# Patient Record
Sex: Female | Born: 2005 | Race: Black or African American | Hispanic: No | Marital: Single | Smoking: Former smoker
Health system: Southern US, Community
[De-identification: ages and names within clinical notes are randomized; demographics above are authoritative.]

## PROBLEM LIST (undated history)

## (undated) ENCOUNTER — Inpatient Hospital Stay (HOSPITAL_COMMUNITY): Payer: Self-pay

## (undated) DIAGNOSIS — F319 Bipolar disorder, unspecified: Secondary | ICD-10-CM

## (undated) DIAGNOSIS — F909 Attention-deficit hyperactivity disorder, unspecified type: Secondary | ICD-10-CM

## (undated) DIAGNOSIS — H539 Unspecified visual disturbance: Secondary | ICD-10-CM

## (undated) DIAGNOSIS — R51 Headache: Secondary | ICD-10-CM

## (undated) DIAGNOSIS — R519 Headache, unspecified: Secondary | ICD-10-CM

## (undated) DIAGNOSIS — F32A Depression, unspecified: Secondary | ICD-10-CM

## (undated) HISTORY — PX: NO PAST SURGERIES: SHX2092

---
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
## 2023-12-30 NOTE — Progress Notes (Signed)
 [facility] New Patient Visit Latoya Flores DOB: 11-02-2005  MRN: [id number] Visit Date: 12/30/2023  Encounter Provider: Manuelita Murray Pizza, MD   Subjective:  Cc:  Chief Complaint  Patient presents with  . Menorrhagia    Irregular cycles  . Weight Gain  . mood change  . Dysmenorrhea   Latoya Flores is a 18 y.o. female who presents today for evaluation and treatment of bleeding with nexplanon.  Periods occur once or twice a month. No pattern.  Sometimes bleeds and has to change pad q 1hour which could be 2 weeks long and bad cramping which pt did not have before nexplanon.    Also weight gain and some issues with depression.   Objective:  BP 118/80   Ht 1.575 m (5' 2)   Wt 83.9 kg (185 lb)   BMI 33.84 kg/m  There are no problems to display for this patient.   History reviewed. No pertinent past medical history.  History reviewed. No pertinent surgical history.    Allergies  Allergen Reactions  . Kiwi (Actinidia Chinensis) Hives and Itching    Tongue itches  . Mango Hives  . Mango Flavor Rash  . Papaya Rash  . Shrimp Rash  . Sulfa  (Sulfonamide Antibiotics) Rash and Hives      Social History   Social History Narrative  . Not on file    No family history on file.  Review of Systems A 14-point ROS was performed with pertinent positives/negatives noted in the HPI. The remainder of the ROS are negative.  Physical Exam  Constitutional: She is well groomed. She appears well-developed.  Abdominal: Soft. Bowel sounds are normal. No hepatosplenomegaly. No hernia. No masses. No tenderness.  Genitourinary:  EGBUS: within normal limits with age-appropriate hair distribution. No lesions noted.  Urethral meatus: Normal size and location without prolapse. Urethra: Nontender. Without masses. Bladder: Nontender. No cystocele. Vagina: There is no blood  in the vaginal vault. No discharge. No rectocele.  Cervix:  No friability. No  lesions Uterus: Normal size. Normal shape. Nontender.  Adnexa: There are no palpable adnexal masses. No tenderness.  Anus: Normal. No prolapsed hemorrhoids. No lesions. Psychiatric: She has a normal mood and affect. Her behavior is normal. Judgment and thought content normal.    A chaperone was present for the exam Labs: Recent Results (from the past week)  POC HCG Qualitative, Urine   Collection Time: 12/30/23  4:00 PM  Result Value Ref Range   HCG, Urine, POC Negative Negative   Internal Control Acceptable    Kit/Device Lot # [id number]    Kit/Device Expiration Date 05/22/25     Assessment/Plan:     ICD-10-CM   1. Pregnancy examination or test, negative result  Z32.02 POC HCG Qualitative, Urine    2. Menorrhagia with irregular cycle  N92.1      Pt offered removal nexplanon or estrace/OCPS to thicken endometrium to help with bleeding. Since also concerned about weight gain and depression Will remove nexplanon and start Yasmin for bleeding.  Will also check STDS to ensure not source of bleeding  I have personally spent 25 minutes involved in face-to-face and non-face-to-face activities for this patient on the day of the visit.  Professional time spent includes the following activities, in addition to those noted in the documentation: -reviewing her medications, allergies, her medical, surgical, obstetric, gynecologic, and family history in preparation for the patient's visit, -reviewing the chart in preparation, -in counseling and educating the patient  and/or family/caregiver if present, -in ordering medications, test, or procedures, -and in documentation after patient has checked out from office.  Orders Placed This Encounter  Procedures  . POC HCG Qualitative, Urine    Requested Prescriptions    No prescriptions requested or ordered in this encounter      Electronically signed by: Manuelita Murray Pizza, MD 12/30/2023 4:11 PM

## 2024-01-11 NOTE — Progress Notes (Signed)
 Insertion/Removal/Reinsertion of Contraceptive Device  Date/Time: 01/11/2024 12:06 AM  Performed by: Manuelita Murray Pizza, MD Authorized by: Manuelita Murray Pizza, MD   Consent:    Consent obtained:  Written   Consent given by:  Patient   Procedural risks discussed:  Bleeding and infection   Patient questions answered: yes     Patient agrees, verbalizes understanding, and wants to proceed: yes     Educational handouts given: yes     Instructions and paperwork completed: yes     Indication: presence of non-biodegradable drug delivery implant   Pre-procedure:    Prepped with: povidone-iodine   Local anesthetic:  Lidocaine  with epinephrine Procedure: removal Removed intact device with no complications: Yes  Procedure Type: subdermal Post-procedure:  Patient tolerated procedure well: Yes Patient will follow up after next period: Yes Comments:Will start OCPS to help with bleeding which is why along with weight gain implant removed.

## 2024-05-19 ENCOUNTER — Ambulatory Visit: Payer: MEDICAID | Admitting: Certified Nurse Midwife

## 2024-05-19 ENCOUNTER — Ambulatory Visit (INDEPENDENT_AMBULATORY_CARE_PROVIDER_SITE_OTHER): Payer: MEDICAID | Admitting: Certified Nurse Midwife

## 2024-05-19 VITALS — BP 126/84 | HR 98 | Ht 62.0 in | Wt 195.9 lb

## 2024-05-19 DIAGNOSIS — Z3009 Encounter for other general counseling and advice on contraception: Secondary | ICD-10-CM

## 2024-05-19 DIAGNOSIS — Z3046 Encounter for surveillance of implantable subdermal contraceptive: Secondary | ICD-10-CM

## 2024-05-19 DIAGNOSIS — Z30011 Encounter for initial prescription of contraceptive pills: Secondary | ICD-10-CM

## 2024-05-19 MED ORDER — NORETHINDRONE 0.35 MG PO TABS: 1.0000 | ORAL_TABLET | Freq: Every day | ORAL | 11 refills | Status: DC

## 2024-05-19 NOTE — Progress Notes (Signed)
 Pt presents for Nexplanon removal. Pt wants the pill for bc. Pt has no questions or concerns at this time.

## 2024-05-24 NOTE — Progress Notes (Signed)
 GYNECOLOGY CLINIC PROCEDURE NOTE  [name] is a 18 y.o. G2P1001 here for Nexplanon removal. She desires to switch to Birth Control pills. Previously had implant in for 1 year. There is a note in from [facility] that reports the devices was removed, however patient denies that visit and device felt in place by Provider today. No other gynecologic concerns.   Nexplanon Removal Patient was given informed consent for removal of her Nexplanon.  Appropriate time out taken. Nexplanon site identified.  Area prepped in usual sterile fashon. One ml of 1% lidocaine  was used to anesthetize the area at the distal end of the implant. A small stab incision was made right beside the implant on the distal portion.  The Nexplanon rod was grasped using hemostats and removed with minimal difficulty.  There was minimal blood loss. There were no complications. Steri-strips were applied over the small incision.  A pressure bandage was applied to reduce any bruising.  The patient tolerated the procedure well and was given post procedure instructions.  Patient is planning to use Micronor  for contraception.    Emilio Delilah HERO, CNM 05/24/2024 6:07 PM

## 2024-06-17 ENCOUNTER — Encounter: Payer: Self-pay | Admitting: Certified Nurse Midwife

## 2024-09-17 NOTE — Progress Notes (Signed)
" °  Subjective Patient ID: Latoya Flores is a 18 y.o. female.  Chief Complaint  Patient presents with   Urinary Tract Infection    + lower abdomen pain, frequency, and  some burning with her urination. Per patient she is also having some nausea.    The following information was reviewed by members of the visit team:  Tobacco  Allergies  Meds  OB Status      HPI Patient reports urinary frequency and urgency and burning with urination.  She does not have any lower abdominal pain per se although palpation there makes her feel the need to urinate.  She reports she has been taking the minipill and having very irregular periods show she she has no idea when she could have gotten pregnant. Review of Systems Review of systems is negative except as noted in nurses notes in HPI. Objective Physical Exam Well-developed well-nourished female in no apparent distress Back is nontender to palpation percussion over the CVA areas Abdomen is soft and nontender palpation suprapubically makes you feel the need to urinate though. Assessment/Plan Diagnosis UTI, pregnancy.  Will treat with Keflex  and have prescribed prenatal vitamins with folate as well.  Patient will try ginger ale and small meals first.  If anything gets worse or come back in for nausea is not improving or if she begins vomiting she will come back .  I have put in a consult with OB/GYN.   Electronically signed: Deward Gwenn Ates, MD 09/17/2024  3:11 PM   "

## 2024-09-28 ENCOUNTER — Other Ambulatory Visit (INDEPENDENT_AMBULATORY_CARE_PROVIDER_SITE_OTHER): Payer: Self-pay

## 2024-09-28 ENCOUNTER — Ambulatory Visit: Payer: MEDICAID | Admitting: *Deleted

## 2024-09-28 VITALS — BP 122/79 | HR 93 | Wt 192.9 lb

## 2024-09-28 DIAGNOSIS — Z3A01 Less than 8 weeks gestation of pregnancy: Secondary | ICD-10-CM

## 2024-09-28 DIAGNOSIS — Z3481 Encounter for supervision of other normal pregnancy, first trimester: Secondary | ICD-10-CM

## 2024-09-28 DIAGNOSIS — Z3480 Encounter for supervision of other normal pregnancy, unspecified trimester: Secondary | ICD-10-CM | POA: Insufficient documentation

## 2024-09-28 MED ORDER — BLOOD PRESSURE KIT DEVI: 1.0000 | 0 refills | Status: AC | PRN

## 2024-09-28 MED ORDER — GOJJI WEIGHT SCALE MISC: 1.0000 | 0 refills | Status: AC | PRN

## 2024-09-28 NOTE — Progress Notes (Signed)
..  New OB Intake  I connected with Latoya Flores  on 09/28/2024 at  9:15 AM EST in person and verified that I am speaking with the correct person using two identifiers. Nurse is located at [facility] and pt is located at [facility].  I discussed the limitations, risks, security and privacy concerns of performing an evaluation and management service by telephone and the availability of in person appointments. I also discussed with the patient that there may be a patient responsible charge related to this service. The patient expressed understanding and agreed to proceed.  I explained I am completing New OB Intake today. We discussed EDD of 05/17/25 based on US  at [redacted]w[redacted]d weeks. Pt is G3P2002. I reviewed her allergies, medications and Medical/Surgical/OB history.    Patient Active Problem List   Diagnosis Date Noted   Supervision of other normal pregnancy, antepartum 11/25/2021   Hypokalemia 06/11/2016   At risk for intentional self-harm 06/06/2016   Depression, major, recurrent, moderate (HCC) 06/06/2016   MDD (major depressive disorder), recurrent severe, without psychosis (HCC) 06/06/2016     Concerns addressed today  Delivery Plans Plans to deliver at [facility]. Discussed the nature of our practice with multiple providers including residents and students as well as female and female providers. Due to the size of the practice, the delivering provider may not be the same as those providing prenatal care.   Patient is possibly interested in water birth.  MyChart/Babyscripts MyChart access verified. I explained pt will have some visits in office and some virtually. Babyscripts instructions given and order placed. Patient verifies receipt of registration text/e-mail. Account successfully created and app downloaded. If patient is a candidate for Optimized scheduling, add to sticky note.   Blood Pressure Cuff/Weight Scale Blood pressure cuff ordered for patient to pick-up from [employer]. Explained  after first prenatal appt pt will check weekly and document in Babyscripts. Patient does not have weight scale; order sent to [facility], patient may track weight weekly in Babyscripts.  Anatomy US  Explained first scheduled US  will be around 19 weeks. Anatomy US  scheduled for TBD at TBD.  Is patient a candidate for Babyscripts Optimization? No, due to Risk Factors   First visit review I reviewed new OB appt with patient. Explained pt will be seen by Dr. Rudy at first visit. Discussed [facility] genetic screening with patient. Requests Panorama and Horizon.. Routine prenatal labs OB Urine collected at today's visits. Further prenatal labs deferred until reconfirmation of viability.   Last Pap No results found for: DIAGPAP  Latoya [name], RN 09/28/2024  9:51 AM

## 2024-09-28 NOTE — Patient Instructions (Signed)

## 2024-09-29 ENCOUNTER — Other Ambulatory Visit: Payer: Self-pay

## 2024-09-29 MED ORDER — DOXYLAMINE-PYRIDOXINE 10-10 MG PO TBEC: 2.0000 | DELAYED_RELEASE_TABLET | Freq: Every day | ORAL | 5 refills | Status: AC

## 2024-10-01 ENCOUNTER — Encounter (HOSPITAL_COMMUNITY): Payer: Self-pay | Admitting: Obstetrics and Gynecology

## 2024-10-01 ENCOUNTER — Inpatient Hospital Stay (HOSPITAL_COMMUNITY): Payer: MEDICAID

## 2024-10-01 ENCOUNTER — Inpatient Hospital Stay (HOSPITAL_COMMUNITY)
Admission: AD | Admit: 2024-10-01 | Discharge: 2024-10-01 | Disposition: A | Discharge status: Home / Self Care | Payer: MEDICAID | Attending: Obstetrics and Gynecology | Admitting: Obstetrics and Gynecology

## 2024-10-01 DIAGNOSIS — O26891 Other specified pregnancy related conditions, first trimester: Secondary | ICD-10-CM | POA: Diagnosis present

## 2024-10-01 DIAGNOSIS — O418X10 Other specified disorders of amniotic fluid and membranes, first trimester, not applicable or unspecified: Secondary | ICD-10-CM

## 2024-10-01 DIAGNOSIS — O2341 Unspecified infection of urinary tract in pregnancy, first trimester: Secondary | ICD-10-CM | POA: Diagnosis not present

## 2024-10-01 DIAGNOSIS — O208 Other hemorrhage in early pregnancy: Secondary | ICD-10-CM | POA: Diagnosis not present

## 2024-10-01 DIAGNOSIS — Z3A01 Less than 8 weeks gestation of pregnancy: Secondary | ICD-10-CM | POA: Diagnosis not present

## 2024-10-01 DIAGNOSIS — O219 Vomiting of pregnancy, unspecified: Secondary | ICD-10-CM | POA: Diagnosis not present

## 2024-10-01 DIAGNOSIS — O468X1 Other antepartum hemorrhage, first trimester: Secondary | ICD-10-CM | POA: Diagnosis not present

## 2024-10-01 LAB — CBC
HCT: 35.8 % — ABNORMAL LOW (ref 36.0–46.0)
Hemoglobin: 11.7 g/dL — ABNORMAL LOW (ref 12.0–15.0)
MCH: 23.8 pg — ABNORMAL LOW (ref 26.0–34.0)
MCHC: 32.7 g/dL (ref 30.0–36.0)
MCV: 72.9 fL — ABNORMAL LOW (ref 80.0–100.0)
Platelets: 299 K/uL (ref 150–400)
RBC: 4.91 MIL/uL (ref 3.87–5.11)
RDW: 15.8 % — ABNORMAL HIGH (ref 11.5–15.5)
WBC: 10 K/uL (ref 4.0–10.5)
nRBC: 0 % (ref 0.0–0.2)

## 2024-10-01 LAB — URINALYSIS, ROUTINE W REFLEX MICROSCOPIC
Bilirubin Urine: NEGATIVE
Glucose, UA: NEGATIVE mg/dL
Hgb urine dipstick: NEGATIVE
Ketones, ur: NEGATIVE mg/dL
Nitrite: POSITIVE — AB
Protein, ur: 30 mg/dL — AB
Specific Gravity, Urine: 1.024 (ref 1.005–1.030)
pH: 6 (ref 5.0–8.0)

## 2024-10-01 LAB — COMPREHENSIVE METABOLIC PANEL WITH GFR
ALT: 11 U/L (ref 0–44)
AST: 14 U/L — ABNORMAL LOW (ref 15–41)
Albumin: 4.4 g/dL (ref 3.5–5.0)
Alkaline Phosphatase: 61 U/L (ref 38–126)
Anion gap: 10 (ref 5–15)
BUN: 12 mg/dL (ref 6–20)
CO2: 23 mmol/L (ref 22–32)
Calcium: 9.8 mg/dL (ref 8.9–10.3)
Chloride: 102 mmol/L (ref 98–111)
Creatinine, Ser: 0.63 mg/dL (ref 0.44–1.00)
GFR, Estimated: >60 mL/min
Glucose, Bld: 94 mg/dL (ref 70–99)
Potassium: 4.3 mmol/L (ref 3.5–5.1)
Sodium: 135 mmol/L (ref 135–145)
Total Bilirubin: 0.3 mg/dL (ref 0.0–1.2)
Total Protein: 7.9 g/dL (ref 6.5–8.1)

## 2024-10-01 MED ORDER — FAMOTIDINE IN NACL 20-0.9 MG/50ML-% IV SOLN
20.0000 mg | Freq: Once | INTRAVENOUS | Status: AC
  Administered 2024-10-01: 20 mg via INTRAVENOUS
  Filled 2024-10-01: qty 50

## 2024-10-01 MED ORDER — CEFADROXIL 500 MG PO CAPS: 500.0000 mg | ORAL_CAPSULE | Freq: Two times a day (BID) | ORAL | 0 refills | Status: AC

## 2024-10-01 MED ORDER — KETOROLAC TROMETHAMINE 30 MG/ML IJ SOLN
30.0000 mg | Freq: Once | INTRAMUSCULAR | Status: AC
  Administered 2024-10-01: 30 mg via INTRAVENOUS
  Filled 2024-10-01: qty 1

## 2024-10-01 MED ORDER — LACTATED RINGERS IV BOLUS
1000.0000 mL | Freq: Once | INTRAVENOUS | Status: AC
  Administered 2024-10-01: 1000 mL via INTRAVENOUS

## 2024-10-01 MED ORDER — ONDANSETRON HCL 8 MG PO TABS: 8.0000 mg | ORAL_TABLET | Freq: Three times a day (TID) | ORAL | 1 refills | Status: AC | PRN

## 2024-10-01 NOTE — MAU Note (Signed)
 Latoya Flores is a 19 y.o. at [redacted]w[redacted]d here in MAU reporting: abdominal pain that she has for the past 5 days that she says has gotten intense since earlier today.  Denies VB or vaginal discharge.  Pt reports that she has been taken nausea medication as someone told her that her abd pain could come from dry heaving and it is not effective.   Pt reports that she took Tylenol  at 0245 and was not effective.     LMP: 07/25/2024 Onset of complaint: this morning  Pain score: 8.5- when lying down a 10 Vitals:   10/01/24 1503  BP: 113/77  Pulse: (!) 109  Resp: 17  Temp: 98.4 F (36.9 C)  SpO2: 100%     FHT: <10weeks  Lab orders placed from triage:  none

## 2024-10-01 NOTE — MAU Provider Note (Signed)
 " History     CSN: [id number]  Arrival date and time: 10/01/24 1441   Event Date/Time   First Provider Initiated Contact with Patient 10/01/24 1524      Chief Complaint  Patient presents with   Abdominal Pain   HPI  Ms.Latoya Flores is a 19 y.o. female G3P2002 @ [redacted]w[redacted]d here in MAU with abdominal pain. She Reports all over abdominal pain, worse at night. No bleeding. The pain seems to be better during the day.  Last BM was yesterday and was normal Last vomiting was this morning. She is taking Diclegis  4x per day which only helps minimally.   Reports dry heaving all day. Not eating or drinking much.   OB History     Gravida  3   Para  2   Term  2   Preterm  0   AB  0   Living  2      SAB  0   IAB  0   Ectopic  0   Multiple  0   Live Births  2           Past Medical History:  Diagnosis Date   ADHD (attention deficit hyperactivity disorder)    Bipolar 1 disorder (HCC)    age 19   Depression    Headache    Vision abnormalities     Past Surgical History:  Procedure Laterality Date   NO PAST SURGERIES      Family History  Adopted: Yes  Problem Relation Age of Onset   Mental illness Mother    Mental illness Maternal Aunt    Alcohol abuse Maternal Uncle    Mental illness Maternal Grandmother     Social History[1]  Allergies: Allergies[2]  Medications Prior to Admission  Medication Sig Dispense Refill Last Dose/Taking   Blood Pressure Monitoring (BLOOD PRESSURE KIT) DEVI 1 kit by Does not apply route as needed. 1 each 0    Doxylamine -Pyridoxine  (DICLEGIS ) 10-10 MG TBEC Take 2 tablets by mouth at bedtime. If symptoms persist, add one tablet in the morning and one in the afternoon 100 tablet 5    ferrous sulfate 324 MG TBEC Take 324 mg by mouth. (Patient not taking: Reported on 09/28/2024)      folic acid  (FOLVITE ) 1 MG tablet Take 1 mg by mouth daily. (Patient not taking: Reported on 09/28/2024)      Misc. Devices (GOJJI WEIGHT SCALE) MISC 1 Device by  Does not apply route as needed. 1 each 0    norethindrone  (MICRONOR ) 0.35 MG tablet Take 1 tablet (0.35 mg total) by mouth daily. (Patient not taking: Reported on 09/28/2024) 28 tablet 11    prenatal vitamin w/FE, FA (PRENATAL 1 + 1) 27-1 MG TABS tablet Take 1 tablet by mouth daily at 12 noon.      Results for orders placed or performed during the hospital encounter of 10/01/24 (from the past 48 hours)  Urinalysis, Routine w reflex microscopic -Urine, Clean Catch     Status: Abnormal   Collection Time: 10/01/24  3:10 PM  Result Value Ref Range   Color, Urine AMBER (A) YELLOW    Comment: BIOCHEMICALS MAY BE AFFECTED BY COLOR   APPearance CLOUDY (A) CLEAR   Specific Gravity, Urine 1.024 1.005 - 1.030   pH 6.0 5.0 - 8.0   Glucose, UA NEGATIVE NEGATIVE mg/dL   Hgb urine dipstick NEGATIVE NEGATIVE   Bilirubin Urine NEGATIVE NEGATIVE   Ketones, ur NEGATIVE NEGATIVE mg/dL   Protein, ur  30 (A) NEGATIVE mg/dL   Nitrite POSITIVE (A) NEGATIVE   Leukocytes,Ua LARGE (A) NEGATIVE   RBC / HPF 21-50 0 - 5 RBC/hpf   WBC, UA 21-50 0 - 5 WBC/hpf   Bacteria, UA RARE (A) NONE SEEN   Squamous Epithelial / HPF 21-50 0 - 5 /HPF   Mucus PRESENT     Comment: Performed at [facility], [address] [place], [place] [address]  CBC     Status: Abnormal   Collection Time: 10/01/24  4:13 PM  Result Value Ref Range   WBC 10.0 4.0 - 10.5 K/uL   RBC 4.91 3.87 - 5.11 MIL/uL   Hemoglobin 11.7 (L) 12.0 - 15.0 g/dL   HCT 64.1 (L) 63.9 - 53.9 %   MCV 72.9 (L) 80.0 - 100.0 fL   MCH 23.8 (L) 26.0 - 34.0 pg   MCHC 32.7 30.0 - 36.0 g/dL   RDW 84.1 (H) 88.4 - 84.4 %   Platelets 299 150 - 400 K/uL   nRBC 0.0 0.0 - 0.2 %    Comment: Performed at [facility], [address], [place], [place] [address]  Comprehensive metabolic panel     Status: Abnormal   Collection Time: 10/01/24  4:13 PM  Result Value Ref Range   Sodium 135 135 - 145 mmol/L   Potassium 4.3 3.5 - 5.1 mmol/L   Chloride 102 98 - 111 mmol/L    CO2 23 22 - 32 mmol/L   Glucose, Bld 94 70 - 99 mg/dL    Comment: Glucose reference range applies only to samples taken after fasting for at least 8 hours.   BUN 12 6 - 20 mg/dL   Creatinine, Ser 9.36 0.44 - 1.00 mg/dL   Calcium 9.8 8.9 - 89.6 mg/dL   Total Protein 7.9 6.5 - 8.1 g/dL   Albumin 4.4 3.5 - 5.0 g/dL   AST 14 (L) 15 - 41 U/L   ALT 11 0 - 44 U/L   Alkaline Phosphatase 61 38 - 126 U/L   Total Bilirubin 0.3 0.0 - 1.2 mg/dL   GFR, Estimated >39 >39 mL/min    Comment: (NOTE) Calculated using the CKD-EPI Creatinine Equation (2021)    Anion gap 10 5 - 15    Comment: Performed at [facility], [address], [place], [place] [address]     Review of Systems  Constitutional:  Negative for fever.  Gastrointestinal:  Positive for nausea and vomiting.  Genitourinary:  Negative for dysuria, flank pain and frequency.   Physical Exam   Blood pressure 113/77, pulse (!) 109, temperature 98.4 F (36.9 C), temperature source Oral, resp. rate 17, height 5' 2 (1.575 m), weight 85.9 kg, last menstrual period 07/25/2024, SpO2 100%, unknown if currently breastfeeding.  Patient Vitals for the past 24 hrs:  BP Temp Temp src Pulse Resp SpO2 Height Weight  10/01/24 1822 118/66 98.1 F (36.7 C) Oral 95 17 100 % -- --  10/01/24 1503 113/77 98.4 F (36.9 C) Oral (!) 109 17 100 % 5' 2 (1.575 m) 85.9 kg     Physical Exam Vitals reviewed.  Constitutional:      General: She is not in acute distress.    Appearance: She is well-developed. She is obese. She is not ill-appearing, toxic-appearing or diaphoretic.  HENT:     Head: Normocephalic.  Abdominal:     Tenderness: There is generalized abdominal tenderness. There is no guarding or rebound.  Neurological:  Mental Status: She is alert and oriented to person, place, and time.  Psychiatric:        Behavior: Behavior normal.    MAU Course  Procedures  MDM CBC stable Urine consistent with UTI> urine culture pending  Lr  Bolus with pepcid  and toradol  30 mg, patient feeling much better.   US  reassuring, discussed findings in detail with patient.   Assessment and Plan   1. Subchorionic hematoma in first trimester, single or unspecified fetus   2. Urinary tract infection in mother during first trimester of pregnancy   3. [redacted] weeks gestation of pregnancy      P:  Dc home Rx: Duricef, Zofran  Return if symptoms worsen> pyelo precautions Follow up with OB.  Small, frequent meals   Kevonta Phariss, Delon I, NP 10/01/2024 10:10 PM      [1]  Social History Tobacco Use   Smoking status: Former    Current packs/day: 0.00    Average packs/day: 1.0 packs/day    Types: Cigarettes    Quit date: 06/16/2021    Years since quitting: 3.2   Smokeless tobacco: Never  Vaping Use   Vaping status: Former   Start date: 09/24/2021   Quit date: 08/22/2024  Substance Use Topics   Alcohol use: Not Currently    Alcohol/week: 3.0 standard drinks of alcohol    Types: 3 Standard drinks or equivalent per week   Drug use: Not Currently    Types: Marijuana    Comment: quit after found out she was pregnant  [2]  Allergies Allergen Reactions   Kiwi Extract Itching    Tongue itches   Papaya Derivatives    Sulfa  Antibiotics    Mango Flavoring Agent (Non-Screening) Rash   "

## 2024-10-02 ENCOUNTER — Ambulatory Visit: Payer: Self-pay | Admitting: Obstetrics and Gynecology

## 2024-10-02 LAB — URINE CULTURE, OB REFLEX

## 2024-10-02 LAB — CULTURE, OB URINE

## 2024-10-04 LAB — CULTURE, OB URINE: Culture: >=100000 — AB

## 2024-10-12 ENCOUNTER — Other Ambulatory Visit: Payer: Self-pay

## 2024-10-12 ENCOUNTER — Ambulatory Visit: Payer: MEDICAID | Admitting: *Deleted

## 2024-10-12 VITALS — BP 102/73 | HR 108 | Wt 188.4 lb

## 2024-10-12 DIAGNOSIS — Z362 Encounter for other antenatal screening follow-up: Secondary | ICD-10-CM

## 2024-10-12 DIAGNOSIS — Z3481 Encounter for supervision of other normal pregnancy, first trimester: Secondary | ICD-10-CM

## 2024-10-12 DIAGNOSIS — Z3480 Encounter for supervision of other normal pregnancy, unspecified trimester: Secondary | ICD-10-CM

## 2024-10-12 DIAGNOSIS — Z3A09 9 weeks gestation of pregnancy: Secondary | ICD-10-CM

## 2024-10-12 NOTE — Progress Notes (Signed)
 Latoya Flores presents for repeat US  OB limited for FHR. Low FHR on previous viability scan 09/28/24. FHR 168 today. Fetal appearance and CRL C/W expected GA.

## 2024-10-12 NOTE — Patient Instructions (Signed)
[facility] has a partnership with the [employer] to provide prenatal navigation for the most needed resources in our community. In order to see how we can help connect you to these resources we need consent to contact you. Please complete the very short consent using the link below:   English Link: [email or web address]  Spanish Link: [email or web address]  

## 2024-11-02 ENCOUNTER — Encounter: Payer: Self-pay | Admitting: Obstetrics

## 2024-12-21 ENCOUNTER — Other Ambulatory Visit: Payer: MEDICAID

## 2024-12-21 ENCOUNTER — Ambulatory Visit: Payer: MEDICAID
